# Patient Record
Sex: Male | Born: 1980 | Hispanic: Yes | Marital: Single | Smoking: Never smoker
Health system: Southern US, Community
[De-identification: ages and names within clinical notes are randomized; demographics above are authoritative.]

---

## 2019-03-05 ENCOUNTER — Encounter (HOSPITAL_COMMUNITY): Payer: Self-pay | Admitting: Family Medicine

## 2019-03-05 ENCOUNTER — Emergency Department (HOSPITAL_COMMUNITY)
Admission: EM | Admit: 2019-03-05 | Discharge: 2019-03-05 | Disposition: A | Discharge status: Home / Self Care | Payer: Self-pay | Attending: Emergency Medicine | Admitting: Emergency Medicine

## 2019-03-05 ENCOUNTER — Emergency Department (HOSPITAL_COMMUNITY): Payer: Self-pay

## 2019-03-05 ENCOUNTER — Other Ambulatory Visit: Payer: Self-pay

## 2019-03-05 DIAGNOSIS — R0789 Other chest pain: Secondary | ICD-10-CM | POA: Insufficient documentation

## 2019-03-05 LAB — TROPONIN I (HIGH SENSITIVITY)
Troponin I (High Sensitivity): <2 ng/L (ref <18)
Troponin I (High Sensitivity): <2 ng/L (ref <18)

## 2019-03-05 LAB — BASIC METABOLIC PANEL
Anion gap: 11 (ref 5–15)
BUN: 18 mg/dL (ref 6–20)
CO2: 23 mmol/L (ref 22–32)
Calcium: 9.1 mg/dL (ref 8.9–10.3)
Chloride: 106 mmol/L (ref 98–111)
Creatinine, Ser: 0.92 mg/dL (ref 0.61–1.24)
GFR calc Af Amer: >60 mL/min (ref >60)
GFR calc non Af Amer: >60 mL/min (ref >60)
Glucose, Bld: 101 mg/dL — ABNORMAL HIGH (ref 70–99)
Potassium: 3.6 mmol/L (ref 3.5–5.1)
Sodium: 140 mmol/L (ref 135–145)

## 2019-03-05 LAB — CBC
HCT: 49.8 % (ref 39.0–52.0)
Hemoglobin: 17.3 g/dL — ABNORMAL HIGH (ref 13.0–17.0)
MCH: 30.2 pg (ref 26.0–34.0)
MCHC: 34.7 g/dL (ref 30.0–36.0)
MCV: 87.1 fL (ref 80.0–100.0)
Platelets: 207 10*3/uL (ref 150–400)
RBC: 5.72 MIL/uL (ref 4.22–5.81)
RDW: 12.8 % (ref 11.5–15.5)
WBC: 7.1 10*3/uL (ref 4.0–10.5)
nRBC: 0 % (ref 0.0–0.2)

## 2019-03-05 MED ORDER — SODIUM CHLORIDE 0.9% FLUSH: 3.0000 mL | Freq: Once | INTRAVENOUS | Status: DC

## 2019-03-05 NOTE — Discharge Instructions (Addendum)
Ibuprofen 600 mg 3 times daily for the next 5 days.  Rest.  Follow-up with your primary doctor if symptoms or not improving in the next few days, and return to the ER if symptoms significantly worsen or change.

## 2019-03-05 NOTE — ED Provider Notes (Signed)
[facility] DEPT Provider Note   CSN: [id number] Arrival date & time: 03/05/19  1844     History Chief Complaint  Patient presents with  . Chest Pain    Luke Kemp is a 39 y.o. male.  Patient is a 39 year old male presenting with complaints of chest discomfort.  He has been experiencing this for the past 2 weeks.  He describes intermittent discomfort in the center of his chest that is worse when he moves and breathes.  He denies any nausea, diaphoresis, shortness of breath, or radiation to the arm or jaw.  He denies any fevers, chills, or cough.  He denies any recent exertional symptoms.  He does tell me that he has been arguing with his girlfriend recently and states that this seems to exacerbate his symptoms.  Patient has no cardiac risk factors and no prior cardiac history.  The history is provided by the patient.  Chest Pain Pain location:  Substernal area Pain quality: tightness   Pain radiates to:  Does not radiate Pain severity:  Moderate Onset quality:  Gradual Duration:  2 weeks Timing:  Intermittent Progression:  Worsening Chronicity:  New Relieved by:  Nothing Worsened by:  Nothing Ineffective treatments:  None tried      History reviewed. No pertinent past medical history.  There are no problems to display for this patient.   History reviewed. No pertinent surgical history.     History reviewed. No pertinent family history.  Social History   Tobacco Use  . Smoking status: Never Smoker  . Smokeless tobacco: Never Used  Substance Use Topics  . Alcohol use: Yes    Comment: 1x every 2 weeks, 2 beers   . Drug use: Never    Home Medications Prior to Admission medications   Not on File    Allergies    Patient has no known allergies.  Review of Systems   Review of Systems  Cardiovascular: Positive for chest pain.  All other systems reviewed and are negative.   Physical Exam Updated Vital Signs BP (!)  152/107 (BP Location: Left Arm)   Pulse 90   Temp 98.2 F (36.8 C) (Oral)   Resp 16   Ht 6' (1.829 m)   Wt 90.7 kg   SpO2 97%   BMI 27.12 kg/m   Physical Exam Vitals and nursing note reviewed.  Constitutional:      General: He is not in acute distress.    Appearance: He is well-developed. He is not diaphoretic.  HENT:     Head: Normocephalic and atraumatic.  Cardiovascular:     Rate and Rhythm: Normal rate and regular rhythm.     Heart sounds: No murmur. No friction rub.  Pulmonary:     Effort: Pulmonary effort is normal. No respiratory distress.     Breath sounds: Normal breath sounds. No wheezing or rales.  Abdominal:     General: Bowel sounds are normal. There is no distension.     Palpations: Abdomen is soft.     Tenderness: There is no abdominal tenderness.  Musculoskeletal:        General: Normal range of motion.     Cervical back: Normal range of motion and neck supple.     Right lower leg: No tenderness. No edema.     Left lower leg: No tenderness. No edema.  Skin:    General: Skin is warm and dry.  Neurological:     Mental Status: He is alert and oriented to  person, place, and time.     Coordination: Coordination normal.     ED Results / Procedures / Treatments   Labs (all labs ordered are listed, but only abnormal results are displayed) Labs Reviewed  BASIC METABOLIC PANEL - Abnormal; Notable for the following components:      Result Value   Glucose, Bld 101 (*)    All other components within normal limits  CBC - Abnormal; Notable for the following components:   Hemoglobin 17.3 (*)    All other components within normal limits  TROPONIN I (HIGH SENSITIVITY)  TROPONIN I (HIGH SENSITIVITY)    EKG EKG Interpretation  Date/Time:  Wednesday March 05 2019 19:18:48 EST Ventricular Rate:  81 PR Interval:    QRS Duration: 97 QT Interval:  375 QTC Calculation: 436 R Axis:   75 Text Interpretation: Sinus rhythm No old tracing to compare Confirmed by  Meridee Score [phone]) on 03/05/2019 7:34:18 PM   Radiology DG Chest 2 View  Result Date: 03/05/2019 CLINICAL DATA:  Chest pain EXAM: CHEST - 2 VIEW COMPARISON:  None. FINDINGS: The heart size and mediastinal contours are within normal limits. Both lungs are clear. The visualized skeletal structures are unremarkable. IMPRESSION: No active cardiopulmonary disease. Electronically Signed   By: Deatra Robinson M.D.   On: 03/05/2019 19:38    Procedures Procedures (including critical care time)  Medications Ordered in ED Medications  sodium chloride flush (NS) 0.9 % injection 3 mL (has no administration in time range)    ED Course  I have reviewed the triage vital signs and the nursing notes.  Pertinent labs & imaging results that were available during my care of the patient were reviewed by me and considered in my medical decision making (see chart for details).    MDM Rules/Calculators/A&P  Patient presents here with complaints of chest discomfort that has been ongoing for the past 2 weeks.  His symptoms seem atypical for cardiac pain.  His work-up shows a normal EKG and negative troponin x2.  Patient will be treated with NSAIDs for what I suspect to be musculoskeletal pain.  There is also the possibility that this could be stress/anxiety related as the patient states it seems to get worse when he argues with his girlfriend.  Final Clinical Impression(s) / ED Diagnoses Final diagnoses:  None    Rx / DC Orders ED Discharge Orders    None       Geoffery Lyons, MD 03/05/19 2227

## 2019-03-05 NOTE — ED Triage Notes (Signed)
Patient is complaining of mid-sternal chest pain that started 2 weeks ago. Pain described as sharp and constant. Associated symptoms of shortness of breath and diaphoresis. Skin is warm and dry. No obvious signs of distress.

## 2020-07-08 IMAGING — CR DG CHEST 2V
2 series · 2 of 2 positions shown · non-contrast
Comparison: None.

CLINICAL DATA: Chest pain

EXAM:
CHEST - 2 VIEW

[w chest pa]
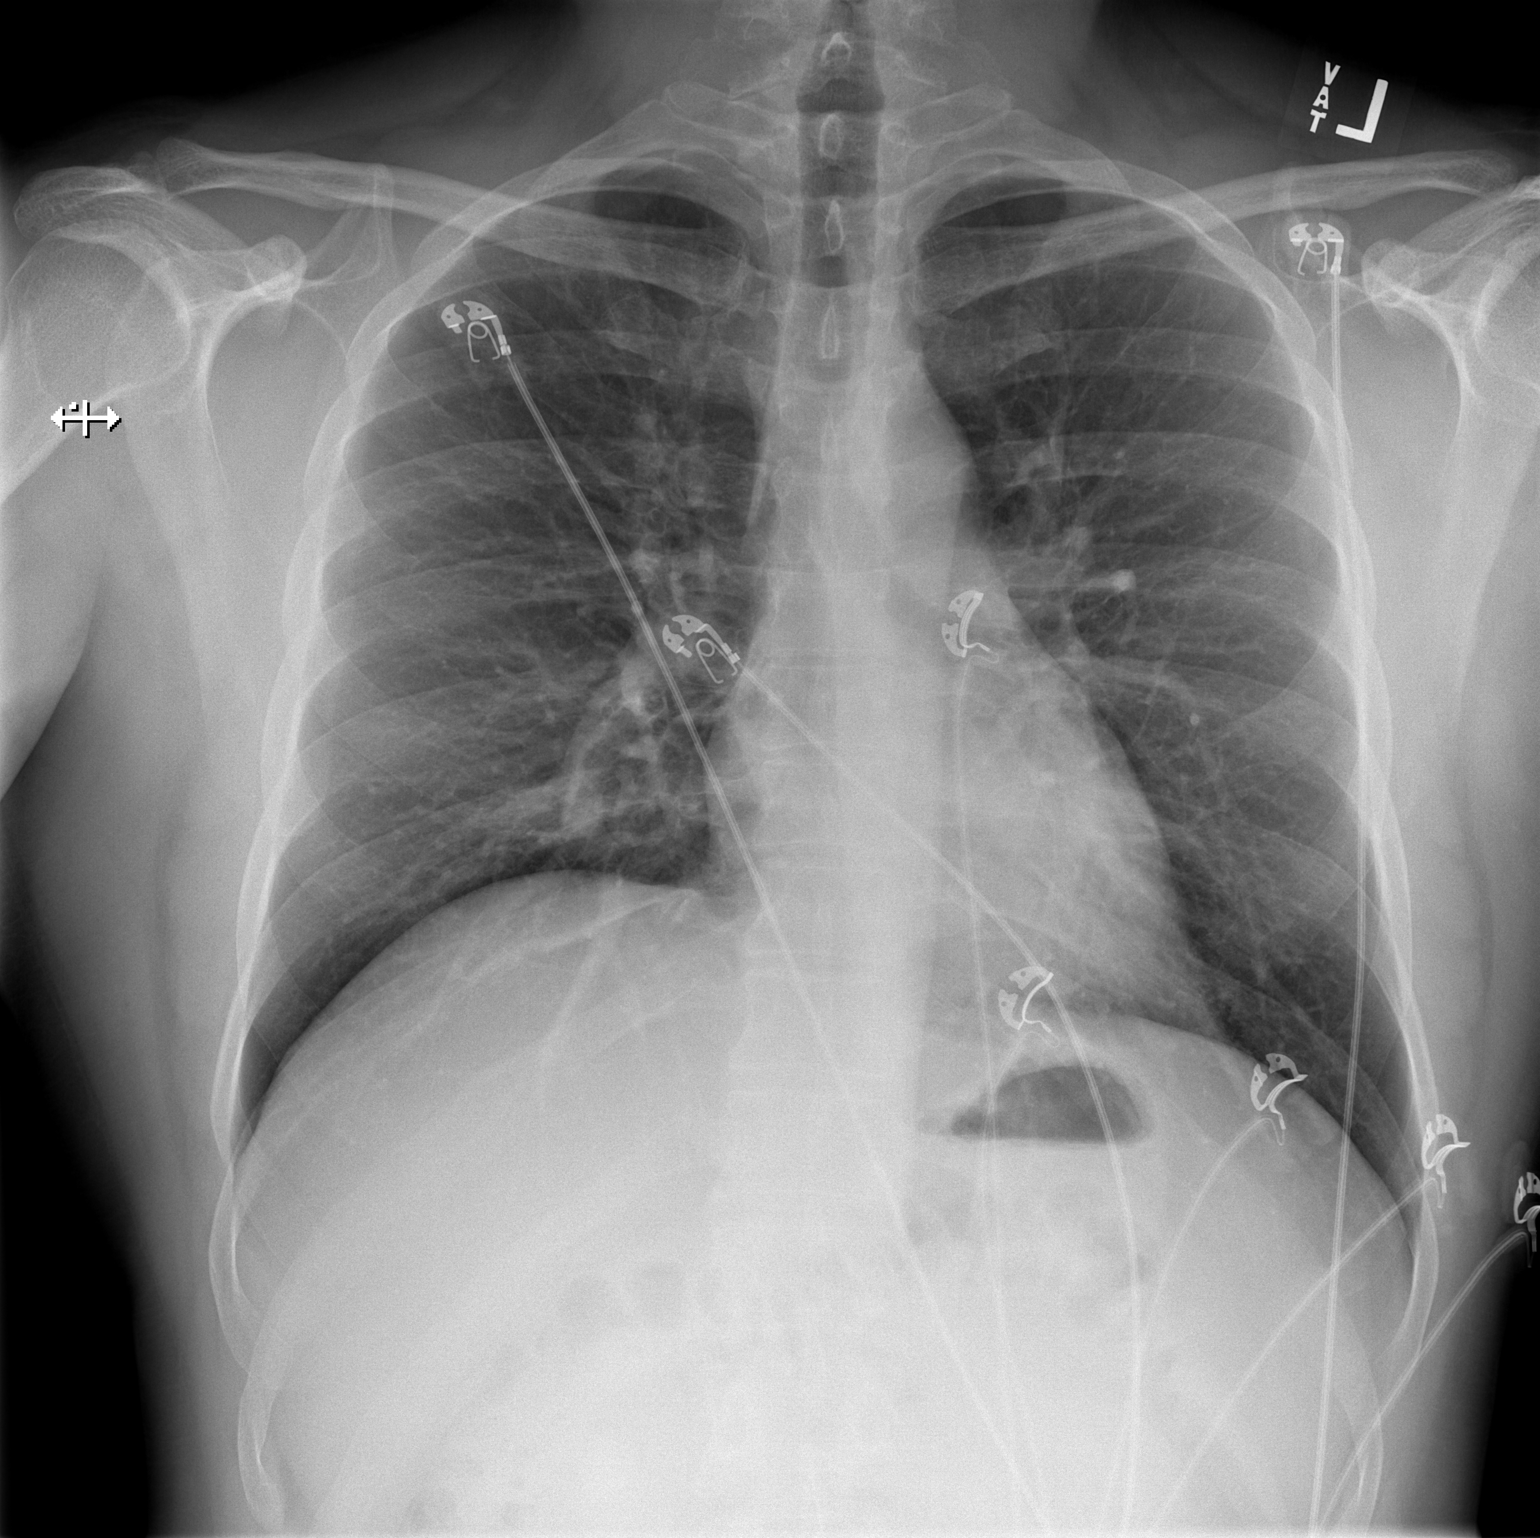

[w chest lat]
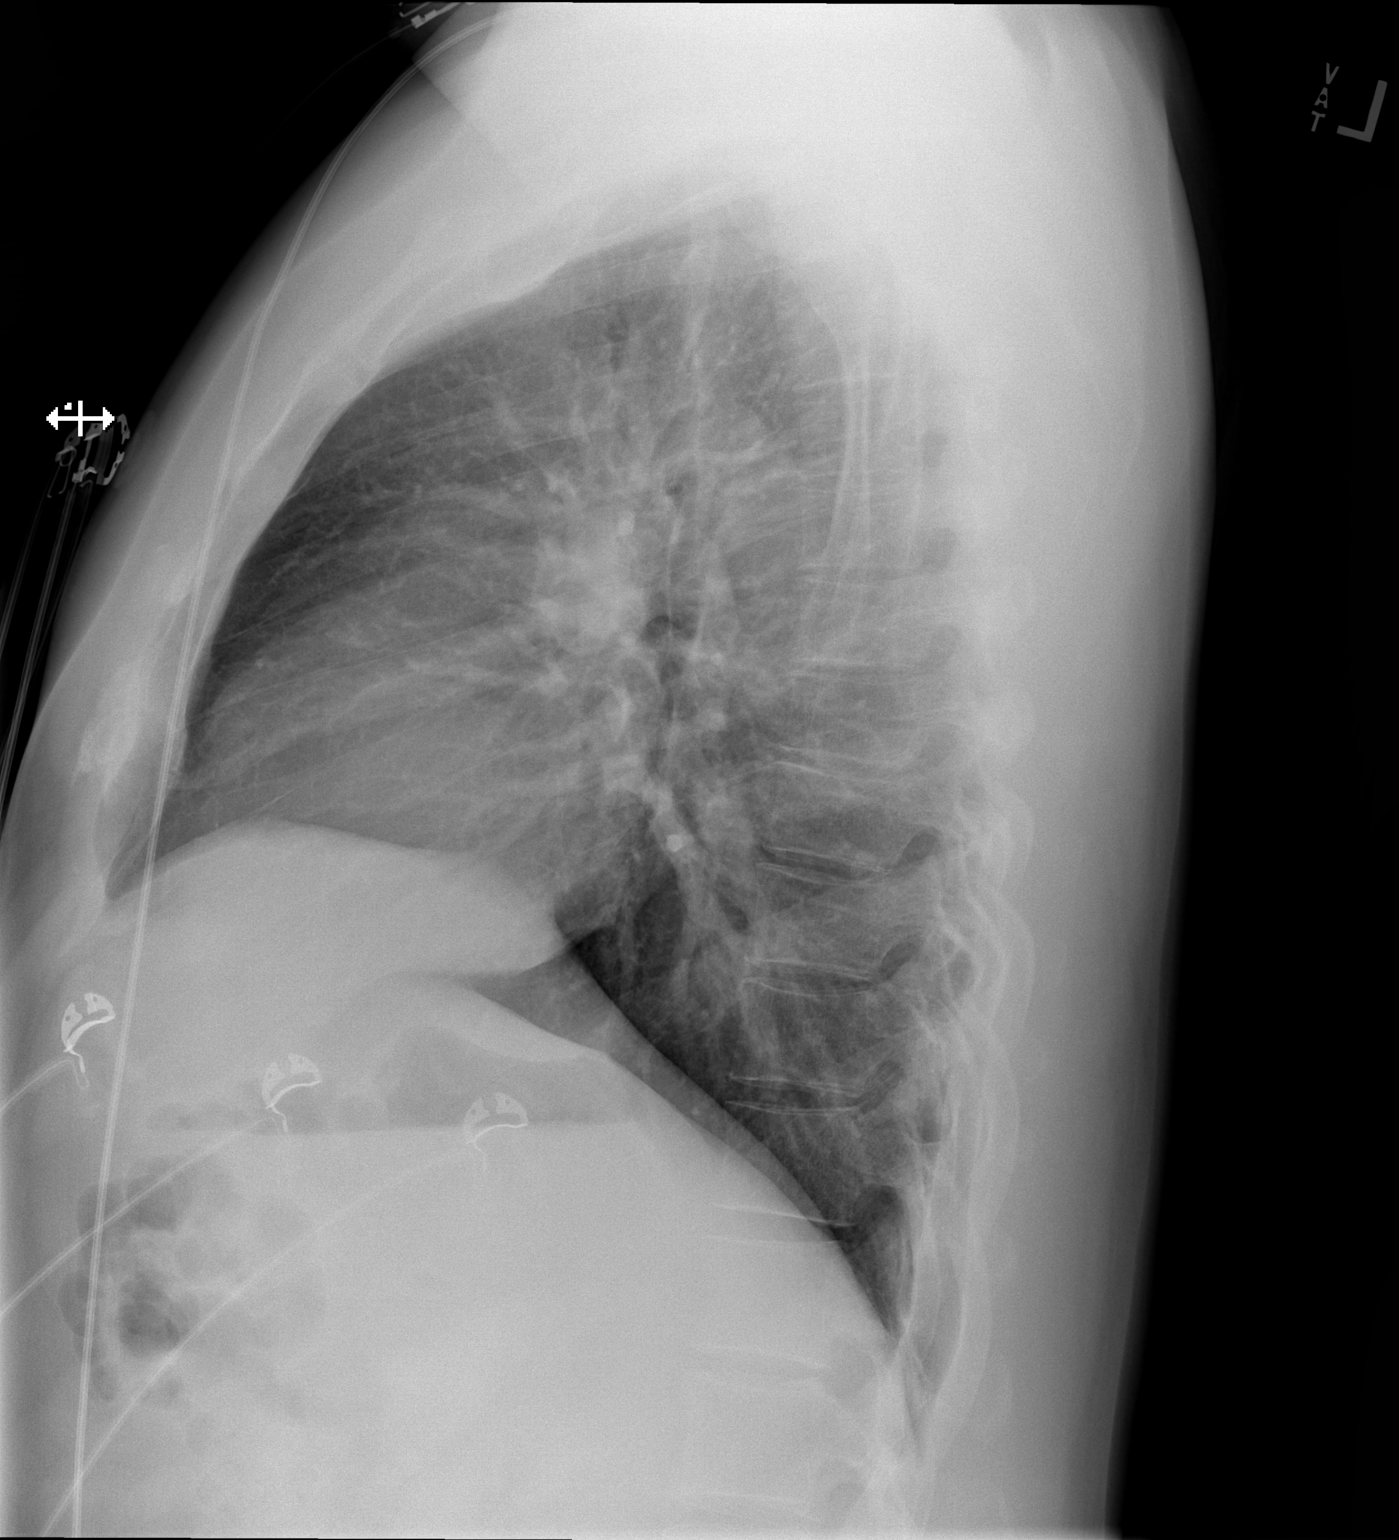

[2 of 2 positions shown; findings below may reference images not displayed]

FINDINGS: The heart size and mediastinal contours are within normal limits.
Both lungs are clear. The visualized skeletal structures are
unremarkable.
IMPRESSION: No active cardiopulmonary disease.
